# Patient Record
Sex: Female | Born: 1995 | Race: White | Hispanic: No | Marital: Single
Health system: Southern US, Community
[De-identification: ages and names within clinical notes are randomized; demographics above are authoritative.]

---
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
## 2016-01-26 ENCOUNTER — Telehealth: Payer: Self-pay | Admitting: Physician Assistant

## 2016-01-26 NOTE — Telephone Encounter (Signed)
CB# [phone] Patient would like [place] to mail her a copy of her immunizations   [address] [place], [place] [address]

## 2016-02-02 NOTE — Telephone Encounter (Signed)
Immunization record per [facility] sent to pt
